# Patient Record
Sex: Male | Born: 1975 | Race: White | Hispanic: No | Marital: Married | Smoking: Never smoker
Health system: Southern US, Community
[De-identification: ages and names within clinical notes are randomized; demographics above are authoritative.]

---

## 2002-10-15 ENCOUNTER — Encounter: Payer: Self-pay | Admitting: Emergency Medicine

## 2002-10-15 ENCOUNTER — Emergency Department (HOSPITAL_COMMUNITY): Admission: EM | Admit: 2002-10-15 | Discharge: 2002-10-15 | Payer: Self-pay | Admitting: Emergency Medicine

## 2008-09-24 ENCOUNTER — Emergency Department (HOSPITAL_BASED_OUTPATIENT_CLINIC_OR_DEPARTMENT_OTHER): Admission: EM | Admit: 2008-09-24 | Discharge: 2008-09-24 | Payer: Self-pay | Admitting: Emergency Medicine

## 2008-09-24 ENCOUNTER — Ambulatory Visit: Payer: Self-pay | Admitting: Diagnostic Radiology

## 2008-11-29 ENCOUNTER — Emergency Department (HOSPITAL_BASED_OUTPATIENT_CLINIC_OR_DEPARTMENT_OTHER): Admission: EM | Admit: 2008-11-29 | Discharge: 2008-11-29 | Payer: Self-pay | Admitting: Emergency Medicine

## 2009-09-20 ENCOUNTER — Emergency Department (HOSPITAL_COMMUNITY): Admission: EM | Admit: 2009-09-20 | Discharge: 2009-09-20 | Payer: Self-pay | Admitting: Emergency Medicine

## 2009-09-25 ENCOUNTER — Emergency Department (HOSPITAL_COMMUNITY): Admission: EM | Admit: 2009-09-25 | Discharge: 2009-09-25 | Payer: Self-pay | Admitting: Family Medicine

## 2010-02-11 ENCOUNTER — Emergency Department (HOSPITAL_BASED_OUTPATIENT_CLINIC_OR_DEPARTMENT_OTHER): Admission: EM | Admit: 2010-02-11 | Discharge: 2010-02-11 | Payer: Self-pay | Admitting: Emergency Medicine

## 2010-10-27 LAB — DIFFERENTIAL
Basophils Absolute: 0.1 10*3/uL (ref 0.0–0.1)
Lymphocytes Relative: 7 % — ABNORMAL LOW (ref 12–46)
Monocytes Absolute: 0.8 10*3/uL (ref 0.1–1.0)
Neutro Abs: 11.5 10*3/uL — ABNORMAL HIGH (ref 1.7–7.7)

## 2010-10-27 LAB — CBC
HCT: 48.4 % (ref 39.0–52.0)
Hemoglobin: 15.8 g/dL (ref 13.0–17.0)
MCHC: 32.7 g/dL (ref 30.0–36.0)
MCV: 88.1 fL (ref 78.0–100.0)
Platelets: 236 10*3/uL (ref 150–400)
RBC: 5.49 MIL/uL (ref 4.22–5.81)
RDW: 13.9 % (ref 11.5–15.5)
WBC: 13.5 10*3/uL — ABNORMAL HIGH (ref 4.0–10.5)

## 2010-10-27 LAB — BASIC METABOLIC PANEL
BUN: 19 mg/dL (ref 6–23)
CO2: 29 mEq/L (ref 19–32)
Calcium: 9.9 mg/dL (ref 8.4–10.5)
Chloride: 105 mEq/L (ref 96–112)
Creatinine, Ser: 1.1 mg/dL (ref 0.4–1.5)
GFR calc Af Amer: >60 mL/min (ref >60)
GFR calc non Af Amer: >60 mL/min (ref >60)
Glucose, Bld: 103 mg/dL — ABNORMAL HIGH (ref 70–99)
Potassium: 4.7 mEq/L (ref 3.5–5.1)
Sodium: 145 mEq/L (ref 135–145)

## 2010-10-27 LAB — POCT TOXICOLOGY PANEL

## 2010-10-27 LAB — ETHANOL: Alcohol, Ethyl (B): <5 mg/dL (ref 0–10)

## 2010-10-29 LAB — CBC
HCT: 41.6 % (ref 39.0–52.0)
MCV: 86.9 fL (ref 78.0–100.0)
Platelets: 347 10*3/uL (ref 150–400)
RDW: 12.7 % (ref 11.5–15.5)

## 2010-10-29 LAB — BASIC METABOLIC PANEL
BUN: 20 mg/dL (ref 6–23)
CO2: 27 mEq/L (ref 19–32)
Chloride: 100 mEq/L (ref 96–112)
Glucose, Bld: 96 mg/dL (ref 70–99)
Potassium: 4.8 mEq/L (ref 3.5–5.1)

## 2010-10-29 LAB — DIFFERENTIAL
Basophils Absolute: 0.2 10*3/uL — ABNORMAL HIGH (ref 0.0–0.1)
Eosinophils Relative: 0 % (ref 0–5)
Lymphs Abs: 0.9 10*3/uL (ref 0.7–4.0)
Monocytes Absolute: 1.3 10*3/uL — ABNORMAL HIGH (ref 0.1–1.0)

## 2012-01-15 ENCOUNTER — Emergency Department (HOSPITAL_BASED_OUTPATIENT_CLINIC_OR_DEPARTMENT_OTHER)
Admission: EM | Admit: 2012-01-15 | Discharge: 2012-01-15 | Disposition: A | Discharge status: Home Health | Payer: Medicaid Other | Attending: Emergency Medicine | Admitting: Emergency Medicine

## 2012-01-15 ENCOUNTER — Emergency Department (HOSPITAL_BASED_OUTPATIENT_CLINIC_OR_DEPARTMENT_OTHER): Payer: Medicaid Other

## 2012-01-15 ENCOUNTER — Encounter (HOSPITAL_BASED_OUTPATIENT_CLINIC_OR_DEPARTMENT_OTHER): Payer: Self-pay | Admitting: *Deleted

## 2012-01-15 DIAGNOSIS — T148XXA Other injury of unspecified body region, initial encounter: Secondary | ICD-10-CM | POA: Insufficient documentation

## 2012-01-15 DIAGNOSIS — IMO0002 Reserved for concepts with insufficient information to code with codable children: Secondary | ICD-10-CM

## 2012-01-15 DIAGNOSIS — X500XXA Overexertion from strenuous movement or load, initial encounter: Secondary | ICD-10-CM | POA: Insufficient documentation

## 2012-01-15 MED ORDER — CYCLOBENZAPRINE HCL 5 MG PO TABS: 5.0000 mg | ORAL_TABLET | Freq: Two times a day (BID) | ORAL | Status: AC | PRN

## 2012-01-15 NOTE — ED Provider Notes (Signed)
History/physical exam/procedure(s) were performed by non-physician practitioner and as supervising physician I was immediately available for consultation/collaboration. I have reviewed all notes and am in agreement with care and plan.   Shelden Raborn S Dyquan Minks, MD 01/15/12 1751 

## 2012-01-15 NOTE — Discharge Instructions (Signed)
Degenerative Disc Disease Degenerative disc disease is a condition caused by the changes that occur in the cushions of the backbone (spinal discs) as you grow older. Spinal discs are soft and compressible discs located between the bones of the spine (vertebrae). They act like shock absorbers. Degenerative disc disease can affect the wholespine. However, the neck and lower back are most commonly affected. Many changes can occur in the spinal discs with aging, such as:  The spinal discs may dry and shrink.   Small tears may occur in the tough, outer covering of the disc (annulus).   The disc space may become smaller due to loss of water.   Abnormal growths in the bone (spurs) may occur. This can put pressure on the nerve roots exiting the spinal canal, causing pain.   The spinal canal may become narrowed.  CAUSES  Degenerative disc disease is a condition caused by the changes that occur in the spinal discs with aging. The exact cause is not known, but there is a genetic basis for many patients. Degenerative changes can occur due to loss of fluid in the disc. This makes the disc thinner and reduces the space between the backbones. Small cracks can develop in the outer layer of the disc. This can lead to the breakdown of the disc. You are more likely to get degenerative disc disease if you are overweight. Smoking cigarettes and doing heavy work such as weightlifting can also increase your risk of this condition. Degenerative changes can start after a sudden injury. Growth of bone spurs can compress the nerve roots and cause pain.  SYMPTOMS  The symptoms vary from person to person. Some people may have no pain, while others have severe pain. The pain may be so severe that it can limit your activities. The location of the pain depends on the part of your backbone that is affected. You will have neck or arm pain if a disc in the neck area is affected. You will have pain in your back, buttocks, or legs if a  disc in the lower back is affected. The pain becomes worse while bending, reaching up, or with twisting movements. The pain may start gradually and then get worse as time passes. It may also start after a major or minor injury. You may feel numbness or tingling in the arms or legs.  DIAGNOSIS  Your caregiver will ask you about your symptoms and about activities or habits that may cause the pain. He or she may also ask about any injuries, diseases, ortreatments you have had earlier. Your caregiver will examine you to check for the range of movement that is possible in the affected area, to check for strength in your extremities, and to check for sensation in the areas of the arms and legs supplied by different nerve roots. An X-ray of the spine may be taken. Your caregiver may suggest other imaging tests, such as a computerized magnetic scan (MRI), if needed.  TREATMENT  Treatment includes rest, modifying your activities, and applying ice and heat. Your caregiver may prescribe medicines to reduce your pain and may ask you to do some exercises to strengthen your back. In some cases, you may need surgery. You and your caregiver will decide on the treatment that is best for you. HOME CARE INSTRUCTIONS   Follow proper lifting and walking techniques as advised by your caregiver.   Maintain good posture.   Exercise regularly as advised.   Perform relaxation exercises.   Change your sitting,   standing, and sleeping habits as advised. Change positions frequently.   Lose weight as advised.   Stop smoking if you smoke.   Wear supportive footwear.  SEEK MEDICAL CARE IF:  The pain does not go away within 1 to 4 weeks. SEEK IMMEDIATE MEDICAL CARE IF:   The pain is severe.   You notice weakness in your arms, hands, or legs.   You begin to lose control of your bladder or bowel.  MAKE SURE YOU:   Understand these instructions.   Will watch your condition.   Will get help right away if you are not  doing well or get worse.  Document Released: 05/02/2007 Document Revised: 06/24/2011 Document Reviewed: 05/02/2007 [employer] Patient Information 2012 [employer]. 

## 2012-01-15 NOTE — ED Notes (Signed)
Pt states he originally injured his back at work 2 weeks ago. Took Aleve and it went away. Reinjured yest.

## 2012-01-15 NOTE — ED Provider Notes (Signed)
History     CSN: [id number]  Arrival date & time 01/15/12  1503   First MD Initiated Contact with Patient 01/15/12 1529      Chief Complaint  Patient presents with  . Back Pain    (Consider location/radiation/quality/duration/timing/severity/associated sxs/prior treatment) HPI Comments: Pt states that he was straining to lift heavy object 2 weeks ago and started having neck pain:pt states that he took ibuprofen and aleve with relief and he did the same think again yesterday and it is worse this time:pt c/o pain at the base of his neck and with turning side to side:pt denies numbness but states that he is having paresthesias in his leg bilaterally  Patient is a 36 y.o. male presenting with neck injury. The history is provided by the patient. No language interpreter was used.  Neck Injury This is a new problem. The current episode started 1 to 4 weeks ago. The problem occurs constantly. The problem has been gradually worsening. The symptoms are aggravated by twisting. He has tried nothing for the symptoms.    History reviewed. No pertinent past medical history.  History reviewed. No pertinent past surgical history.  History reviewed. No pertinent family history.  History  Substance Use Topics  . Smoking status: Never Smoker   . Smokeless tobacco: Not on file  . Alcohol Use: No      Review of Systems  Constitutional: Negative.   Respiratory: Negative.   Cardiovascular: Negative.     Allergies  Review of patient's allergies indicates no known allergies.  Home Medications   Current Outpatient Rx  Name Route Sig Dispense Refill  . IBUPROFEN 200 MG PO TABS Oral Take 400 mg by mouth 2 (two) times daily as needed. For pain    . METHADONE HCL 10 MG/ML PO CONC Oral Take 110 mg by mouth daily.    Marland Kitchen NAPROXEN SODIUM 220 MG PO TABS Oral Take 660 mg by mouth 2 (two) times daily as needed. For pain      BP 147/61  Pulse 94  Temp 97.9 F (36.6 C) (Oral)  Resp 20  Ht 6\' 2"   (1.88 m)  Wt 220 lb (99.791 kg)  BMI 28.25 kg/m2  SpO2 100%  Physical Exam  Nursing note and vitals reviewed. Constitutional: He is oriented to person, place, and time. He appears well-developed and well-nourished.  Cardiovascular: Normal rate and regular rhythm.   Pulmonary/Chest: Effort normal and breath sounds normal.  Abdominal: Soft. Bowel sounds are normal. There is no tenderness.  Musculoskeletal: Normal range of motion.       Cervical back: He exhibits tenderness and bony tenderness.       Thoracic back: Normal.       Lumbar back: Normal.  Neurological: He is alert and oriented to person, place, and time. He exhibits normal muscle tone. Coordination normal.  Skin: Skin is warm and dry.    ED Course  Procedures (including critical care time)  Labs Reviewed - No data to display Dg Cervical Spine Complete  01/15/2012  *RADIOLOGY REPORT*  Clinical Data: Persistent neck pain.  CERVICAL SPINE - COMPLETE 4+ VIEW  Comparison: Cervical spine radiographs 09/20/2009.  Findings: Multiple views of the cervical spine demonstrate no acute displaced fractures or compression fractures.  Prevertebral soft tissues are normal.  Alignment is anatomic.  Mild multilevel degenerative disc disease is noted, most severe C5-C6 and C6-C7. Mild multilevel facet arthropathy is also noted.  IMPRESSION: 1.  No acute radiographic abnormality of the cervical spine. 2.  Mild multilevel degenerative disc disease and cervical spondylosis, as above.  Original Report Authenticated By: Florencia Reasons, M.D.     1. Degenerative disc disease   2. Muscle strain       MDM  Pt having no neurologic symptoms or symptoms of incontinence:will put on muscle relaxers:pt is on methadone        Teressa Lower, NP 01/15/12 1701

## 2012-06-05 DIAGNOSIS — R1084 Generalized abdominal pain: Secondary | ICD-10-CM | POA: Insufficient documentation

## 2012-06-05 DIAGNOSIS — F419 Anxiety disorder, unspecified: Secondary | ICD-10-CM | POA: Insufficient documentation

## 2012-10-27 DIAGNOSIS — K589 Irritable bowel syndrome without diarrhea: Secondary | ICD-10-CM | POA: Insufficient documentation

## 2020-04-19 ENCOUNTER — Encounter (HOSPITAL_COMMUNITY): Payer: Self-pay

## 2020-04-19 ENCOUNTER — Telehealth (HOSPITAL_COMMUNITY): Payer: Self-pay | Admitting: Emergency Medicine

## 2020-04-19 ENCOUNTER — Other Ambulatory Visit: Payer: Self-pay

## 2020-04-19 ENCOUNTER — Ambulatory Visit (HOSPITAL_COMMUNITY)
Admission: EM | Admit: 2020-04-19 | Discharge: 2020-04-19 | Disposition: A | Discharge status: Home / Self Care | Payer: Medicaid Other | Attending: Physician Assistant | Admitting: Physician Assistant

## 2020-04-19 DIAGNOSIS — M25572 Pain in left ankle and joints of left foot: Secondary | ICD-10-CM

## 2020-04-19 DIAGNOSIS — M109 Gout, unspecified: Secondary | ICD-10-CM

## 2020-04-19 MED ORDER — PREDNISONE 5 MG (48) PO TBPK: ORAL_TABLET | ORAL | 0 refills | Status: DC

## 2020-04-19 MED ORDER — DOXYCYCLINE HYCLATE 100 MG PO CAPS: 100.0000 mg | ORAL_CAPSULE | Freq: Two times a day (BID) | ORAL | 0 refills | Status: DC

## 2020-04-19 NOTE — ED Provider Notes (Signed)
[facility]    CSN: [id number] Arrival date & time: 04/19/20  1026      History   Chief Complaint Chief Complaint  Patient presents with  . Ankle Pain    HPI Michael Hurley is a 44 y.o. male.    With a 3 day history of left ankle pain. No known injury. "very painful". Redness and warmth is noted and awoke him from sleep last pm. No known history of gout. No fever or chills.      History reviewed. No pertinent past medical history.  There are no problems to display for this patient.   History reviewed. No pertinent surgical history.     Home Medications    Prior to Admission medications   Medication Sig Start Date End Date Taking? Authorizing Provider  ibuprofen (ADVIL,MOTRIN) 200 MG tablet Take 400 mg by mouth 2 (two) times daily as needed. For pain    [provider]  methadone (DOLOPHINE) 10 MG/ML solution Take 110 mg by mouth daily.    [provider]  naproxen sodium (ANAPROX) 220 MG tablet Take 660 mg by mouth 2 (two) times daily as needed. For pain    [provider]    Family History Family History  Family history unknown: Yes    Social History Social History   Tobacco Use  . Smoking status: Never Smoker  Substance Use Topics  . Alcohol use: No  . Drug use: No     Allergies   Patient has no known allergies.   Review of Systems Review of Systems  Constitutional: Negative for fever.  Musculoskeletal: Positive for arthralgias.  Skin: Positive for color change.  All other systems reviewed and are negative.    Physical Exam Triage Vital Signs ED Triage Vitals  Enc Vitals Group     BP 04/19/20 1129 130/79     Pulse Rate 04/19/20 1129 83     Resp 04/19/20 1129 17     Temp 04/19/20 1129 97.6 F (36.4 C)     Temp Source 04/19/20 1129 Oral     SpO2 04/19/20 1129 100 %     Weight --      Height --      Head Circumference --      Peak Flow --      Pain Score 04/19/20 1128 7     Pain Loc --       Pain Edu? --      Excl. in GC? --    No data found.  Updated Vital Signs BP 130/79 (BP Location: Right Arm)   Pulse 83   Temp 97.6 F (36.4 C) (Oral)   Resp 17   SpO2 100%   Visual Acuity Right Eye Distance:   Left Eye Distance:   Bilateral Distance:    Right Eye Near:   Left Eye Near:    Bilateral Near:     Physical Exam Vitals and nursing note reviewed.  Constitutional:      General: He is not in acute distress.    Appearance: Normal appearance. He is not ill-appearing.  HENT:     Head: Normocephalic and atraumatic.  Pulmonary:     Effort: Pulmonary effort is normal.  Musculoskeletal:        General: Swelling and tenderness present. No signs of injury.     Comments: Left ankle with erythema, warmth and swelling along the ankle joint, pain with light palpation. No frank joint effusion noted  Skin:    General:  Skin is warm and dry.     Comments: Open sores on BLE, no drainage, excoriations are also noted, no frank abscess  Neurological:     General: No focal deficit present.     Mental Status: He is alert.  Psychiatric:        Mood and Affect: Mood normal.        Behavior: Behavior normal.      UC Treatments / Results  Labs (all labs ordered are listed, but only abnormal results are displayed) Labs Reviewed - No data to display  EKG   Radiology No results found.  Procedures Procedures (including critical care time)  Medications Ordered in UC Medications - No data to display  Initial Impression / Assessment and Plan / UC Course  I have reviewed the triage vital signs and the nursing notes.  Pertinent labs & imaging results that were available during my care of the patient were reviewed by me and considered in my medical decision making (see chart for details).     Suspect gout, though with surrounding sores cannot rule out cellulitis. Treat with prednisone pack and cover with Doxycycline. He is instructed to return to ED with fevers, worsening  redness or signs of infection. Otherwise try to establish with a PCP for overall health maintenance, but also to check a uric acid when NOT flaring.  Final Clinical Impressions(s) / UC Diagnoses   Final diagnoses:  None   Discharge Instructions   None    ED Prescriptions    None     PDMP not reviewed this encounter.   Riki Sheer, [place] 04/19/20 1217

## 2020-04-19 NOTE — Discharge Instructions (Addendum)
I suspect you are having a gout flare. Start the prednisone today, may take with 2 Tylenol Arthritis if needed. Because of your sores I am giving you an antibiotic in case this is an infection. If the redness gets worse, pain worse, or start running a fever then that is an emergent need. Keep elevated, ice will also help. Try to maintain with a PCP to check your gout levels (uric acid) in 2-4 weeks.

## 2020-04-19 NOTE — ED Triage Notes (Signed)
Pt presents with left ankle pain & swelling X 3 days: pt states he cannot remember injury.

## 2020-04-25 ENCOUNTER — Telehealth (HOSPITAL_COMMUNITY): Payer: Self-pay

## 2020-04-25 NOTE — Telephone Encounter (Signed)
Pt called concerning his RX prednisone that was not sent over to the pharmacy. Staff called [facility] and gave a verbal script to Pharmacist Ree [place] for patient.

## 2020-04-29 ENCOUNTER — Encounter (HOSPITAL_COMMUNITY): Payer: Self-pay

## 2020-04-29 ENCOUNTER — Other Ambulatory Visit: Payer: Self-pay

## 2020-04-29 ENCOUNTER — Ambulatory Visit (HOSPITAL_COMMUNITY)
Admission: EM | Admit: 2020-04-29 | Discharge: 2020-04-29 | Disposition: A | Discharge status: Home / Self Care | Payer: Medicaid Other | Attending: Family Medicine | Admitting: Family Medicine

## 2020-04-29 ENCOUNTER — Ambulatory Visit (INDEPENDENT_AMBULATORY_CARE_PROVIDER_SITE_OTHER): Payer: Medicaid Other

## 2020-04-29 DIAGNOSIS — W228XXA Striking against or struck by other objects, initial encounter: Secondary | ICD-10-CM | POA: Diagnosis not present

## 2020-04-29 DIAGNOSIS — M79672 Pain in left foot: Secondary | ICD-10-CM

## 2020-04-29 DIAGNOSIS — M25572 Pain in left ankle and joints of left foot: Secondary | ICD-10-CM

## 2020-04-29 MED ORDER — IBUPROFEN 800 MG PO TABS: 800.0000 mg | ORAL_TABLET | Freq: Three times a day (TID) | ORAL | 0 refills | Status: DC

## 2020-04-29 NOTE — ED Provider Notes (Signed)
[facility]    CSN: [id number] Arrival date & time: 04/29/20  1202      History   Chief Complaint Chief Complaint  Patient presents with  . Follow-up    HPI Michael Hurley is a 44 y.o. male.   HPI  Patient was seen about a week ago for what was thought to be possible gout of his ankle.  He had open wounds.  There is erythema and pain.  No trouble.  He was treated with doxycycline and prednisone. Patient took a couple of doxycycline, decided they were working, and stopped Patient did not take his prednisone until the eighth, 4 days ago.  He has managed to take 11 days of his 12-day Dosepak and just the last 4 days.  Denies any side effects.  He states his ankle and foot are no better. He now recalls that he dropped a heavy object on his foot at work before it started hurting.  He wishes x-rays He states that he cannot bear weight on this foot without severe pain  History reviewed. No pertinent past medical history.  There are no problems to display for this patient.   History reviewed. No pertinent surgical history.     Home Medications    Prior to Admission medications   Medication Sig Start Date End Date Taking? Authorizing Provider  ibuprofen (ADVIL) 800 MG tablet Take 1 tablet (800 mg total) by mouth 3 (three) times daily. 04/29/20   Eustace Moore, MD  methadone (DOLOPHINE) 10 MG/ML solution Take 110 mg by mouth daily.    [provider]  naproxen sodium (ANAPROX) 220 MG tablet Take 660 mg by mouth 2 (two) times daily as needed. For pain    [provider]    Family History Family History  Family history unknown: Yes    Social History Social History   Tobacco Use  . Smoking status: Never Smoker  Substance Use Topics  . Alcohol use: No  . Drug use: No     Allergies   Patient has no known allergies.   Review of Systems Review of Systems See HPI  Physical Exam Triage Vital Signs ED Triage Vitals  Enc Vitals  Group     BP 04/29/20 1515 136/79     Pulse Rate 04/29/20 1515 77     Resp 04/29/20 1515 17     Temp 04/29/20 1515 98 F (36.7 C)     Temp Source 04/29/20 1515 Oral     SpO2 04/29/20 1515 100 %     Weight --      Height --      Head Circumference --      Peak Flow --      Pain Score 04/29/20 1513 7     Pain Loc --      Pain Edu? --      Excl. in GC? --    No data found.  Updated Vital Signs BP 136/79 (BP Location: Right Arm)   Pulse 77   Temp 98 F (36.7 C) (Oral)   Resp 17   SpO2 100%      Physical Exam Constitutional:      General: He is not in acute distress.    Appearance: He is well-developed.     Comments: In wheelchair  HENT:     Head: Normocephalic and atraumatic.     Mouth/Throat:     Comments: Mask in place Eyes:     Conjunctiva/sclera: Conjunctivae normal.  Pupils: Pupils are equal, round, and reactive to light.  Cardiovascular:     Rate and Rhythm: Normal rate.  Pulmonary:     Effort: Pulmonary effort is normal. No respiratory distress.  Abdominal:     General: There is no distension.     Palpations: Abdomen is soft.  Musculoskeletal:        General: Normal range of motion.     Cervical back: Normal range of motion.     Comments: Mild to moderate soft tissue swelling in the left foot and ankle.  No erythema or warmth.  Limited range of motion of the ankle secondary to pain.  Tenderness diffusely in the anterior ankle joint.  No tenderness over the malleoli.  There is tenderness in the midfoot over the patient states he dropped a heavy object.  Mild soft tissue swelling in this area as well.  Skin:    General: Skin is warm and dry.  Neurological:     Mental Status: He is alert.  Psychiatric:        Mood and Affect: Mood normal.        Behavior: Behavior normal.      UC Treatments / Results  Labs (all labs ordered are listed, but only abnormal results are displayed) Labs Reviewed - No data to display  EKG   Radiology DG Ankle  Complete Left  Result Date: 04/29/2020 CLINICAL DATA:  Acute left ankle pain after injury 10 days ago. EXAM: LEFT ANKLE COMPLETE - 3+ VIEW COMPARISON:  None. FINDINGS: There is no evidence of fracture, dislocation, or joint effusion. There is no evidence of arthropathy or other focal bone abnormality. Soft tissues are unremarkable. IMPRESSION: Negative. Electronically Signed   By: Lupita Raider M.D.   On: 04/29/2020 17:24   DG Foot Complete Left  Result Date: 04/29/2020 CLINICAL DATA:  Left foot pain after injury 10 days ago EXAM: LEFT FOOT - COMPLETE 3+ VIEW COMPARISON:  None. FINDINGS: There is no evidence of fracture or dislocation. Minimal degenerative changes seen involving the first metatarsophalangeal joint. Soft tissues are unremarkable. 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 No acute abnormality is noted. Electronically Signed   By: Lupita Raider M.D.   On: 04/29/2020 17:22    Procedures Procedures (including critical care time)  Medications Ordered in UC Medications - No data to display  Initial Impression / Assessment and Plan / UC Course  I have reviewed the triage vital signs and the nursing notes.  Pertinent labs & imaging results that were available during my care of the patient were reviewed by me and considered in my medical decision making (see chart for details).     Unclear etiology of patient's severe pain.  Will refer to sports medicine or podiatry for follow-up.  We will treat him with ibuprofen, crutches, limited weightbearing.  He could have early reflex sympathetic dystrophy although he does not have skin changes or temperature changes of the foot. Final Clinical Impressions(s) / UC Diagnoses   Final diagnoses:  Acute left ankle pain  Acute foot pain, left     Discharge Instructions     Stay off of foot and ankle while painful Elevate to reduce pain and swelling Take ibuprofen 3 times a day with  food Wear boot, use crutches for pain relief See orthopedic or podiatry in follow-up   ED Prescriptions    Medication Sig Dispense Auth. Provider   ibuprofen (ADVIL) 800 MG tablet Take 1 tablet (800 mg  total) by mouth 3 (three) times daily. 21 tablet Eustace Moore, MD     PDMP not reviewed this encounter.   Eustace Moore, MD 04/29/20 2129

## 2020-04-29 NOTE — ED Triage Notes (Signed)
Pt presents for follow up for right foot & ankle pain for over a week; pt states after prescribed medication he has not much relief.

## 2020-04-29 NOTE — Discharge Instructions (Signed)
Stay off of foot and ankle while painful Elevate to reduce pain and swelling Take ibuprofen 3 times a day with food Wear boot, use crutches for pain relief See orthopedic or podiatry in follow-up

## 2020-05-12 ENCOUNTER — Ambulatory Visit: Payer: Medicaid Other | Admitting: Podiatry

## 2020-05-26 ENCOUNTER — Other Ambulatory Visit: Payer: Self-pay

## 2020-05-26 ENCOUNTER — Ambulatory Visit (INDEPENDENT_AMBULATORY_CARE_PROVIDER_SITE_OTHER): Payer: Medicaid Other | Admitting: Podiatry

## 2020-05-26 ENCOUNTER — Ambulatory Visit (INDEPENDENT_AMBULATORY_CARE_PROVIDER_SITE_OTHER): Payer: Medicaid Other

## 2020-05-26 DIAGNOSIS — M659 Synovitis and tenosynovitis, unspecified: Secondary | ICD-10-CM | POA: Diagnosis not present

## 2020-05-26 DIAGNOSIS — M216X1 Other acquired deformities of right foot: Secondary | ICD-10-CM | POA: Diagnosis not present

## 2020-05-26 DIAGNOSIS — M109 Gout, unspecified: Secondary | ICD-10-CM | POA: Diagnosis not present

## 2020-05-26 MED ORDER — MELOXICAM 15 MG PO TABS: 15.0000 mg | ORAL_TABLET | Freq: Every day | ORAL | 1 refills | Status: DC

## 2020-05-26 NOTE — Progress Notes (Signed)
   Subjective:  44 y.o. male presenting today for evaluation of right ankle pain is been going on for approximately 1 month now.  Patient states that while he was at work he noticed right ankle pain that gradually got worse throughout the day.  It is currently very painful and severe and he has been wearing a cam boot for the past month.  He was prescribed a Medrol Dosepak which did help with the inflammation and swelling however he continues to have some pain that is slowly healing with time.  He presents for further treatment and evaluation  No past medical history on file.   Objective / Physical Exam:  General:  The patient is alert and oriented x3 in no acute distress. Dermatology:  Skin is warm, dry and supple bilateral lower extremities. Negative for open lesions or macerations. Vascular:  Palpable pedal pulses bilaterally. No edema or erythema noted. Capillary refill within normal limits. Neurological:  Epicritic and protective threshold grossly intact bilaterally.  Musculoskeletal Exam:  Pain on palpation to the anterior lateral medial aspects of the patient's right ankle. Mild edema noted. Range of motion within normal limits to all pedal and ankle joints bilateral. Muscle strength 5/5 in all groups bilateral.   Assessment: 1.  Capsulitis right ankle  Plan of Care:  1. Patient was evaluated. X-Rays reviewed that were taken in the urgent care.  2. Injection of 0.5 mL Celestone Soluspan injected in the patient's right ankle. 3.  Prescription for meloxicam 15 mg daily 4.  Discontinue cam boot.  Ankle brace dispensed today.  Wear daily 5.  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   Felecia Shelling, DPM [facility]  Dr. Felecia Shelling, DPM    [address]                                        [place], [place] [address]                Office [phone]  Fax [phone]

## 2020-06-25 ENCOUNTER — Ambulatory Visit: Payer: Medicaid Other | Admitting: Podiatry

## 2021-03-10 IMAGING — DX DG FOOT COMPLETE 3+V*L*
3 series · 3 of 3 positions shown · non-contrast
Comparison: None.

CLINICAL DATA: Left foot pain after injury 10 days ago

EXAM:
LEFT FOOT - COMPLETE 3+ VIEW

[foot ap]
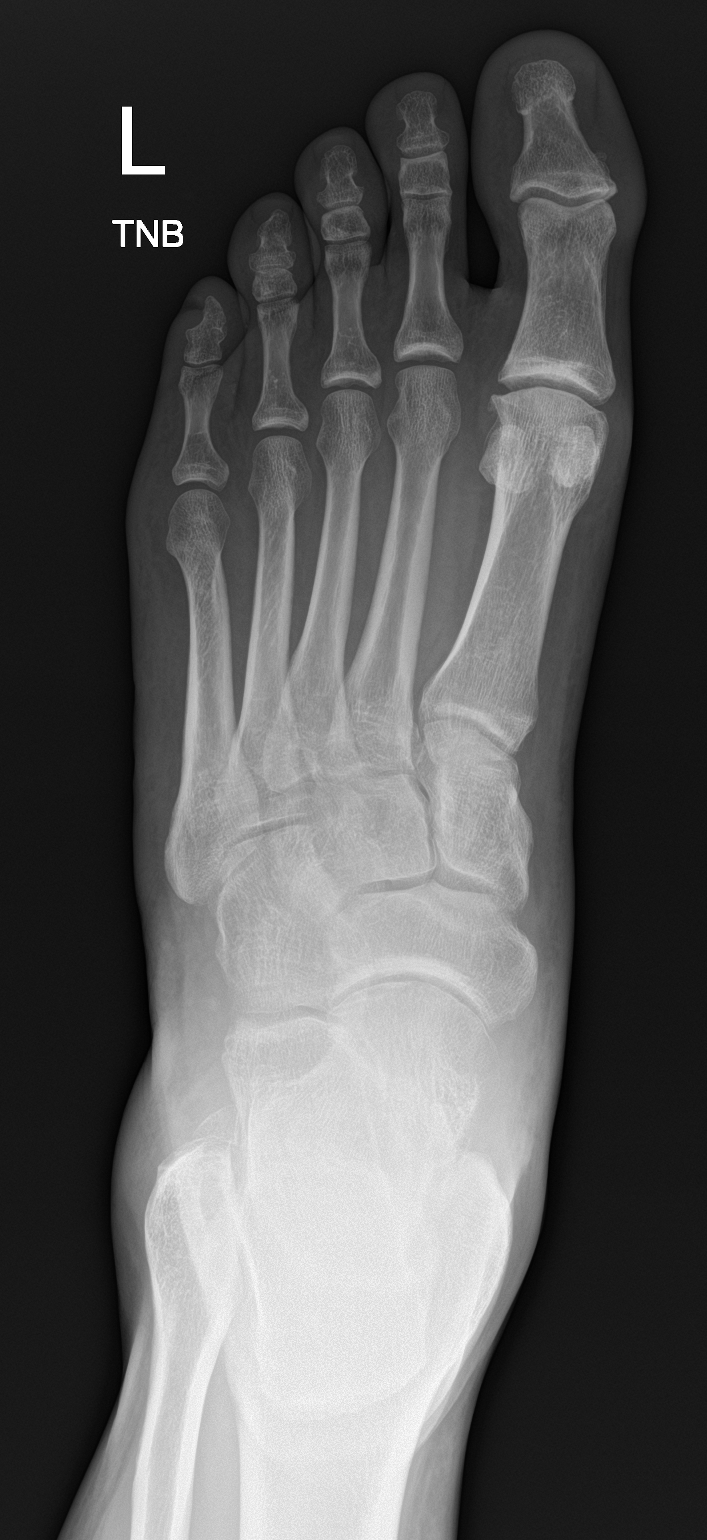

[foot obl]
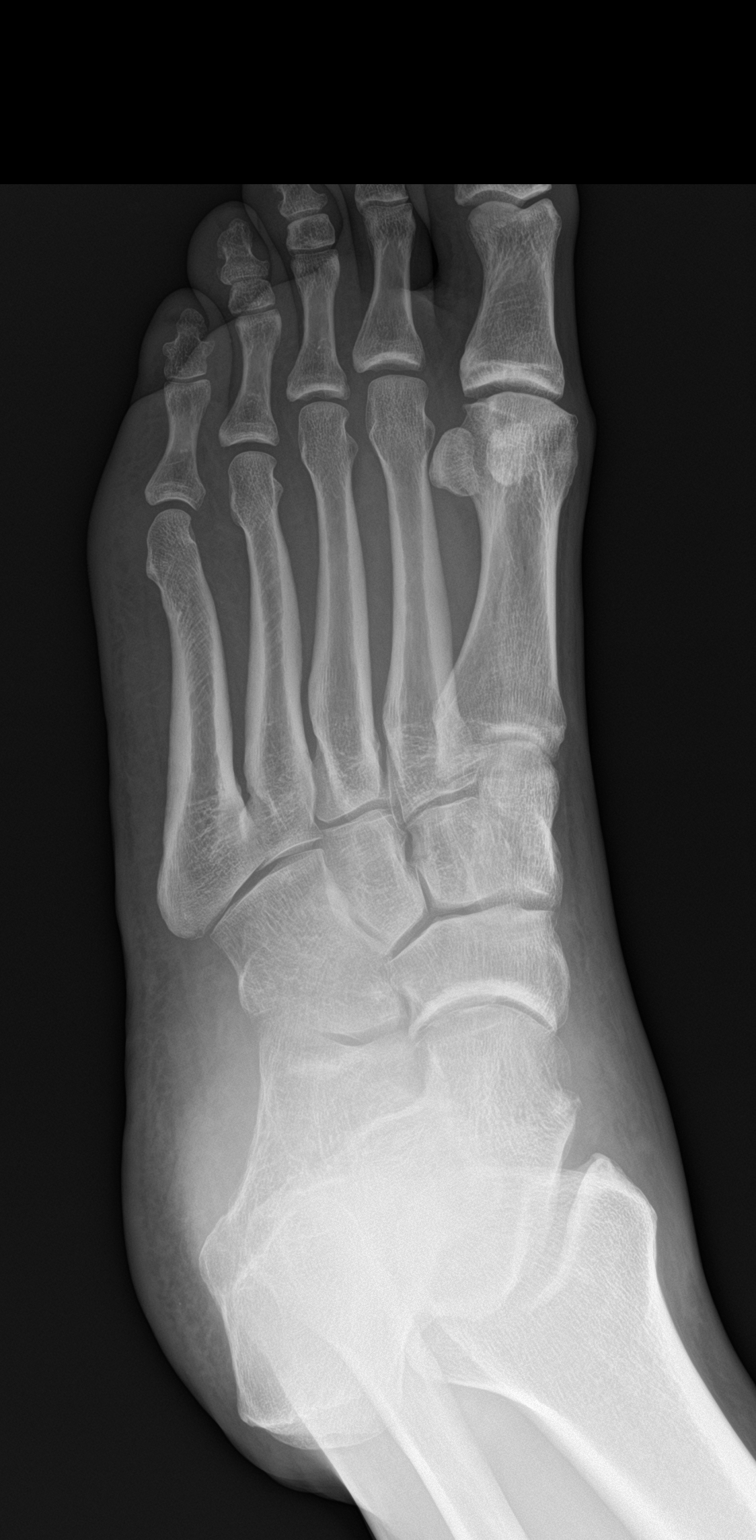

[foot lat]
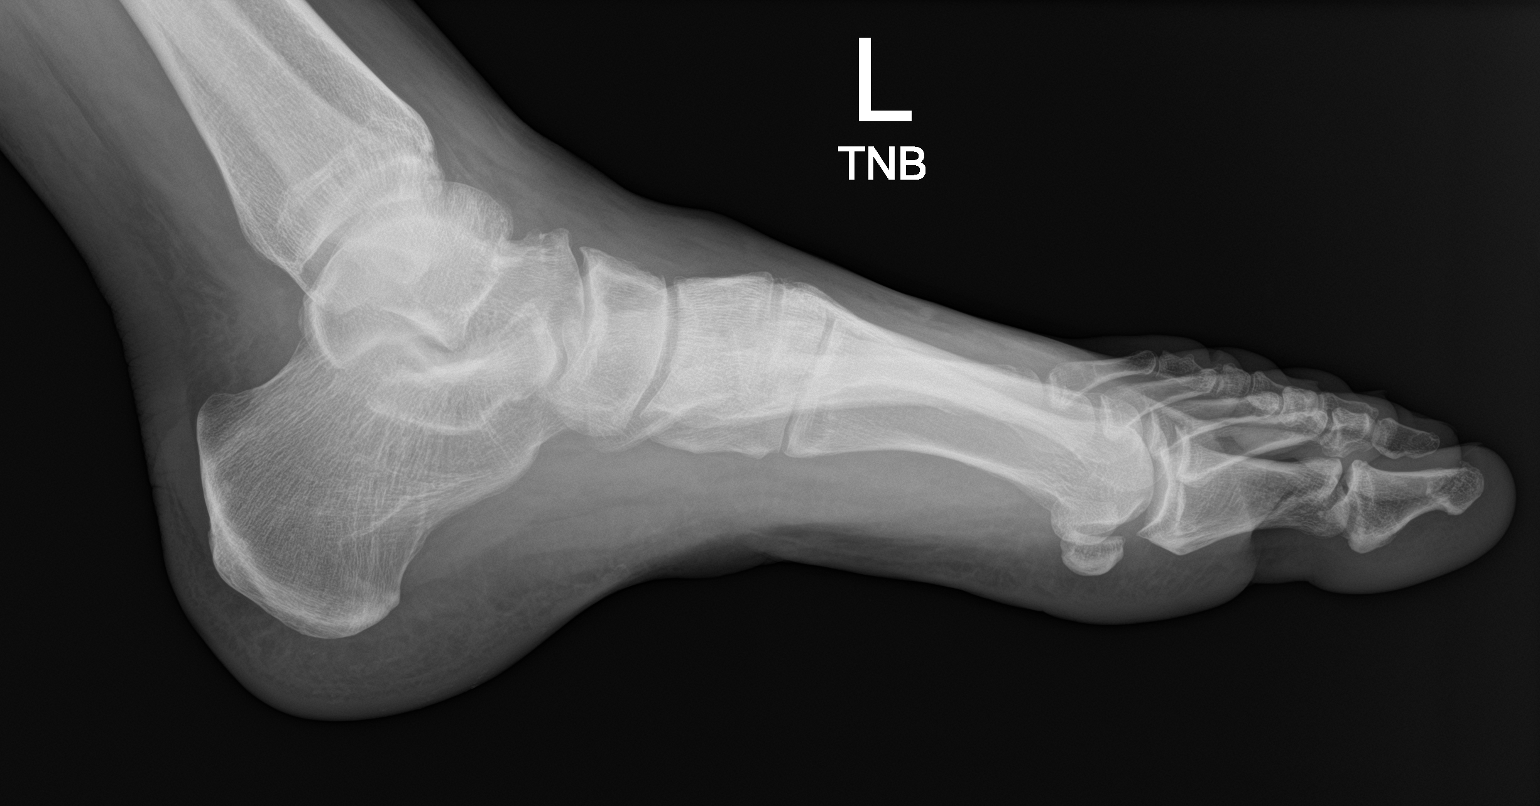

[3 of 3 positions shown; findings below may reference images not displayed]

FINDINGS: There is no evidence of fracture or dislocation. Minimal
degenerative changes seen involving the first metatarsophalangeal
joint. Soft tissues are unremarkable.
IMPRESSION: Minimal degenerative joint disease of the first metatarsophalangeal
joint. No acute abnormality is noted.

## 2022-08-09 ENCOUNTER — Emergency Department (HOSPITAL_BASED_OUTPATIENT_CLINIC_OR_DEPARTMENT_OTHER): Payer: Medicaid Other

## 2022-08-09 ENCOUNTER — Other Ambulatory Visit: Payer: Self-pay

## 2022-08-09 ENCOUNTER — Emergency Department (HOSPITAL_BASED_OUTPATIENT_CLINIC_OR_DEPARTMENT_OTHER)
Admission: EM | Admit: 2022-08-09 | Discharge: 2022-08-10 | Disposition: A | Discharge status: Home / Self Care | Payer: Medicaid Other | Attending: Emergency Medicine | Admitting: Emergency Medicine

## 2022-08-09 DIAGNOSIS — Z23 Encounter for immunization: Secondary | ICD-10-CM | POA: Insufficient documentation

## 2022-08-09 DIAGNOSIS — L03011 Cellulitis of right finger: Secondary | ICD-10-CM

## 2022-08-09 DIAGNOSIS — M79641 Pain in right hand: Secondary | ICD-10-CM | POA: Diagnosis present

## 2022-08-09 LAB — BASIC METABOLIC PANEL
Anion gap: 10 (ref 5–15)
BUN: 16 mg/dL (ref 6–20)
CO2: 27 mmol/L (ref 22–32)
Calcium: 10 mg/dL (ref 8.9–10.3)
Chloride: 100 mmol/L (ref 98–111)
Creatinine, Ser: 0.96 mg/dL (ref 0.61–1.24)
GFR, Estimated: >60 mL/min (ref >60)
Glucose, Bld: 107 mg/dL — ABNORMAL HIGH (ref 70–99)
Potassium: 3.3 mmol/L — ABNORMAL LOW (ref 3.5–5.1)
Sodium: 137 mmol/L (ref 135–145)

## 2022-08-09 LAB — CBC
HCT: 44.1 % (ref 39.0–52.0)
Hemoglobin: 14.6 g/dL (ref 13.0–17.0)
MCH: 29 pg (ref 26.0–34.0)
MCHC: 33.1 g/dL (ref 30.0–36.0)
MCV: 87.5 fL (ref 80.0–100.0)
Platelets: 298 10*3/uL (ref 150–400)
RBC: 5.04 MIL/uL (ref 4.22–5.81)
RDW: 13.5 % (ref 11.5–15.5)
WBC: 10.9 10*3/uL — ABNORMAL HIGH (ref 4.0–10.5)
nRBC: 0 % (ref 0.0–0.2)

## 2022-08-09 MED ORDER — TETANUS-DIPHTH-ACELL PERTUSSIS 5-2.5-18.5 LF-MCG/0.5 IM SUSY
0.5000 mL | PREFILLED_SYRINGE | Freq: Once | INTRAMUSCULAR | Status: AC
  Administered 2022-08-09: 0.5 mL via INTRAMUSCULAR
  Filled 2022-08-09: qty 0.5

## 2022-08-09 MED ORDER — DOXYCYCLINE HYCLATE 100 MG PO CAPS
100.0000 mg | ORAL_CAPSULE | Freq: Two times a day (BID) | ORAL | 0 refills | Status: DC
  Filled 2022-08-09: qty 20, 10d supply, fill #0

## 2022-08-09 MED ORDER — OXYCODONE-ACETAMINOPHEN 5-325 MG PO TABS
1.0000 | ORAL_TABLET | Freq: Once | ORAL | Status: AC
  Administered 2022-08-09: 1 via ORAL
  Filled 2022-08-09: qty 1

## 2022-08-09 NOTE — ED Provider Notes (Signed)
[facility] Provider Note   CSN: [id number] Arrival date & time: 08/09/22  1710     History  Chief Complaint  Patient presents with   Hand Pain    Michael Hurley is a 47 y.o. male.  Pt reports he had a dog bite his thumb in December.  Pt reports wound became infected and then healed.  Pt reports finger began swelling and became painful 3 days ago.  Pt reports wound drains when he pushes on it.   The history is provided by the patient. No language interpreter was used.  Hand Pain This is a new problem. The current episode started more than 2 days ago. The problem occurs constantly. The problem has been gradually worsening. Nothing aggravates the symptoms. Nothing relieves the symptoms.       Home Medications Prior to Admission medications   Medication Sig Start Date End Date Taking? Authorizing Provider  ALPRAZolam Duanne Moron) 0.5 MG tablet Take by mouth. 10/27/12   [provider]  ibuprofen (ADVIL) 800 MG tablet Take 1 tablet (800 mg total) by mouth 3 (three) times daily. 04/29/20   Raylene Everts, MD  meloxicam (MOBIC) 15 MG tablet Take 1 tablet (15 mg total) by mouth daily. 05/26/20   Edrick Kins, DPM  methadone (DOLOPHINE) 10 MG/ML solution Take 110 mg by mouth daily.    [provider]  naproxen sodium (ANAPROX) 220 MG tablet Take 660 mg by mouth 2 (two) times daily as needed. For pain    [provider]      Allergies    Patient has no known allergies.    Review of Systems   Review of Systems  Constitutional:  Negative for fever.  All other systems reviewed and are negative.   Physical Exam Updated Vital Signs BP (!) 149/77 (BP Location: Left Arm)   Pulse (!) 104   Temp 97.7 F (36.5 C) (Oral)   Resp 19   Ht 6\' 2"  (1.88 m)   Wt 99.8 kg   SpO2 100%   BMI 28.25 kg/m  Physical Exam Vitals and nursing note reviewed.  Constitutional:      Appearance: He is well-developed.  HENT:      Head: Normocephalic.  Pulmonary:     Effort: Pulmonary effort is normal.  Abdominal:     General: There is no distension.  Musculoskeletal:        General: Normal range of motion.     Cervical back: Normal range of motion.     Comments: Swollen tender erythematous right thumb,  decreased range of motion,  nv and ns intact   Neurological:     Mental Status: He is alert and oriented to person, place, and time.  Psychiatric:        Mood and Affect: Mood normal.     ED Results / Procedures / Treatments   Labs (all labs ordered are listed, but only abnormal results are displayed) Labs Reviewed  CBC - Abnormal; Notable for the following components:      Result Value   WBC 10.9 (*)    All other components within normal limits  BASIC METABOLIC PANEL - Abnormal; Notable for the following components:   Potassium 3.3 (*)    Glucose, Bld 107 (*)    All other components within normal limits    EKG None  Radiology DG Hand Complete Right  Result Date: 08/09/2022 CLINICAL DATA:  Right thumb swelling EXAM: RIGHT HAND - COMPLETE 3+  VIEW COMPARISON:  None Available. FINDINGS: There is no evidence of fracture or dislocation. There is no evidence of arthropathy or other focal bone abnormality. Soft tissue edema of the right thumb. More mild soft tissue edema of the distal finger. IMPRESSION: 1. No fracture or dislocation of the right hand. 2. Soft tissue edema of the right thumb and more mild soft tissue edema of the distal finger. Electronically Signed   By: Yetta Glassman M.D.   On: 08/09/2022 18:23    Procedures Procedures    Medications Ordered in ED Medications - No data to display  ED Course/ Medical Decision Making/ A&P                             Medical Decision Making Pt complains of   Amount and/or Complexity of Data Reviewed Labs: ordered. Decision-making details documented in ED Course.    Details: Labs ordered reviewed and interpreted  wbc is 10.9  Radiology: ordered  and independent interpretation performed. Decision-making details documented in ED Course.    Details: Xray hand shows soft tissue edema.   Discussion of management or test interpretation with external provider(s): Hand surgery on call consulted  Dr. Caralyn Guile advised he will see pt in office tomorrow.  He advised antibiotics and splint   Risk Prescription drug management. Risk Details: Pt given tetanus,                 Final Clinical Impression(s) / ED Diagnoses Final diagnoses:  Cellulitis of right thumb    Rx / DC Orders ED Discharge Orders          Ordered    doxycycline (VIBRAMYCIN) 100 MG capsule  2 times daily        08/09/22 2357          An After Visit Summary was printed and given to the patient.     Fransico Meadow, PA-C 08/10/22 0000    Regan Lemming, MD 08/10/22 [phone]

## 2022-08-09 NOTE — Discharge Instructions (Addendum)
Return if any problems.

## 2022-08-09 NOTE — ED Triage Notes (Signed)
Pt arrived POV, ambulatory. Pt caox4. Pt c/o pain and swelling in the L thumb that he reports was initially from a dog bite 12/20. Pt further states "it healed, but then got infected again." Pt reports he noticed the swelling and pain in his thumb approx 3 days ago. Pt afebrile. Denies pain at present, last took Motrin at 1700.

## 2022-08-09 NOTE — ED Provider Triage Note (Signed)
Emergency Medicine Provider Triage Evaluation Note  Michael Hurley , a 47 y.o. male  was evaluated in triage.  Pt complains of an infection to his right thumb.   Review of Systems  Positive: Swelling  Negative: fever  Physical Exam  BP (!) 175/88 (BP Location: Left Arm)   Pulse (!) 103   Temp 97.9 F (36.6 C) (Oral)   Resp 19   Ht 6\' 2"  (1.88 m)   Wt 99.8 kg   SpO2 100%   BMI 28.25 kg/m  Gen:   Awake, no distress   Resp:  Normal effort  MSK:   Swollen right thumb,  decreased range of motion nv and ns intact  Other:    Medical Decision Making  Medically screening exam initiated at 5:44 PM.  Appropriate orders placed.  Michael Hurley was informed that the remainder of the evaluation will be completed by another provider, this initial triage assessment does not replace that evaluation, and the importance of remaining in the ED until their evaluation is complete.     Michael Hurley, [place] 08/09/22 1745

## 2022-08-10 ENCOUNTER — Other Ambulatory Visit (HOSPITAL_BASED_OUTPATIENT_CLINIC_OR_DEPARTMENT_OTHER): Payer: Self-pay

## 2022-08-10 MED ORDER — DOXYCYCLINE HYCLATE 100 MG PO CAPS: 100.0000 mg | ORAL_CAPSULE | Freq: Two times a day (BID) | ORAL | 0 refills | Status: DC

## 2022-08-10 MED ORDER — CEPHALEXIN 500 MG PO CAPS: 500.0000 mg | ORAL_CAPSULE | Freq: Four times a day (QID) | ORAL | 0 refills | Status: DC

## 2022-08-10 MED ORDER — DOXYCYCLINE HYCLATE 100 MG PO TABS
100.0000 mg | ORAL_TABLET | Freq: Once | ORAL | Status: AC
  Administered 2022-08-10: 100 mg via ORAL
  Filled 2022-08-10: qty 1

## 2022-08-10 MED ORDER — CEPHALEXIN 250 MG PO CAPS
500.0000 mg | ORAL_CAPSULE | Freq: Once | ORAL | Status: AC
  Administered 2022-08-10: 500 mg via ORAL
  Filled 2022-08-10: qty 2

## 2022-08-10 NOTE — ED Notes (Signed)
Pt verbalized understanding of d/c instructions, meds, and followup care. Denies questions. VSS, no distress noted. Steady gait to exit with all belongings. Scheduled to see ortho tomorrow.

## 2023-01-11 ENCOUNTER — Ambulatory Visit (INDEPENDENT_AMBULATORY_CARE_PROVIDER_SITE_OTHER): Payer: Medicaid Other | Admitting: Family Medicine

## 2023-01-11 ENCOUNTER — Encounter: Payer: Self-pay | Admitting: Family Medicine

## 2023-01-11 VITALS — BP 136/82 | HR 76 | Temp 97.6°F | Ht 73.5 in | Wt 210.0 lb

## 2023-01-11 DIAGNOSIS — F1111 Opioid abuse, in remission: Secondary | ICD-10-CM | POA: Insufficient documentation

## 2023-01-11 DIAGNOSIS — F332 Major depressive disorder, recurrent severe without psychotic features: Secondary | ICD-10-CM | POA: Insufficient documentation

## 2023-01-11 DIAGNOSIS — F1910 Other psychoactive substance abuse, uncomplicated: Secondary | ICD-10-CM

## 2023-01-11 DIAGNOSIS — E876 Hypokalemia: Secondary | ICD-10-CM

## 2023-01-11 DIAGNOSIS — F5105 Insomnia due to other mental disorder: Secondary | ICD-10-CM | POA: Insufficient documentation

## 2023-01-11 DIAGNOSIS — F99 Mental disorder, not otherwise specified: Secondary | ICD-10-CM

## 2023-01-11 DIAGNOSIS — F419 Anxiety disorder, unspecified: Secondary | ICD-10-CM | POA: Diagnosis not present

## 2023-01-11 DIAGNOSIS — R739 Hyperglycemia, unspecified: Secondary | ICD-10-CM

## 2023-01-11 DIAGNOSIS — Z13220 Encounter for screening for lipoid disorders: Secondary | ICD-10-CM

## 2023-01-11 HISTORY — DX: Other psychoactive substance abuse, uncomplicated: F19.10

## 2023-01-11 MED ORDER — MIRTAZAPINE 7.5 MG PO TABS: 7.5000 mg | ORAL_TABLET | Freq: Every day | ORAL | 0 refills | Status: AC

## 2023-01-11 NOTE — Assessment & Plan Note (Signed)
Norton reports significant stress and anxiety related to family and financial issues, with a high PHQ-9 and GAD-7 score.  Plan:  Start mirtazapine 7.5 mg nightly tapering up as needed Consider referral to counseling or mental health specialist for ongoing support Suggest lifestyle modifications, including reducing caffeine intake, and implementing exercise and relaxation techniques

## 2023-01-11 NOTE — Progress Notes (Signed)
Assessment/Plan:   Problem List Items Addressed This Visit       Other   Anxiety   Relevant Medications   mirtazapine (REMERON) 7.5 MG tablet   Other Relevant Orders   TSH   Vitamin D 1,25 dihydroxy   Opioid abuse, in remission (HCC) - Primary    Michael Hurley is a former IV drug user requesting testing for hepatitis  Plan: Screening for blood-borne pathogens including HIV, hepatitis B, and hepatitis C Order hepatitis B and C panel Consider hepatitis B vaccination if not previously done Schedule follow-up for physical examination and further evaluation       Severe episode of recurrent major depressive disorder, without psychotic features (HCC)    Michael Hurley reports significant stress and anxiety related to family and financial issues, with a high PHQ-9 and GAD-7 score.  Plan:  Start mirtazapine 7.5 mg nightly tapering up as needed Consider referral to counseling or mental health specialist for ongoing support Suggest lifestyle modifications, including reducing caffeine intake, and implementing exercise and relaxation techniques      Relevant Medications   mirtazapine (REMERON) 7.5 MG tablet   Other Relevant Orders   Vitamin D 1,25 dihydroxy   Insomnia due to other mental disorder   RESOLVED: IV drug abuse (HCC)   Relevant Orders   CBC with Differential/Platelet   Hepatitis B core antibody, total   Hepatitis B surface antibody,qualitative   Hepatitis B surface antigen   Hepatitis C Antibody   HIV antibody (with reflex)   Other Visit Diagnoses     Hyperglycemia       Relevant Orders   Hemoglobin A1c   Microalbumin / creatinine urine ratio   Urinalysis, Routine w reflex microscopic   Comprehensive metabolic panel   Hypokalemia       Encounter for lipid screening for cardiovascular disease       Relevant Orders   Lipid panel       Medications Discontinued During This Encounter  Medication Reason   methadone (DOLOPHINE) 10 MG/ML solution    meloxicam (MOBIC) 15  MG tablet    ibuprofen (ADVIL) 800 MG tablet    doxycycline (VIBRAMYCIN) 100 MG capsule    cephALEXin (KEFLEX) 500 MG capsule    ALPRAZolam (XANAX) 0.5 MG tablet    naproxen sodium (ANAPROX) 220 MG tablet     Return in about 4 weeks (around 02/08/2023) for mood, physical (fasting labs 1 week before).    Subjective:   Encounter date: 01/11/2023  Michael Hurley is a 47 y.o. male who has Anxiety; Generalized abdominal pain; Irritable bowel syndrome (IBS); Opioid abuse, in remission (HCC); Severe episode of recurrent major depressive disorder, without psychotic features (HCC); and Insomnia due to other mental disorder on their problem list..   He  has a past medical history of IV drug abuse (HCC) (01/11/2023).Marland Kitchen   History of Present Illness:  Hepatitis Concern: Michael Hurley is a former intravenous drug user expressing concerns about potential hepatitis infection. He specifically mentioned wanting to get tested for hepatitis. He is currently asymptomatic, with no reported abdominal pain, nausea, or vomiting.  Stress and Anxiety: Michael Hurley reports significant ongoing stress and anxiety related to his wife's prolonged hospitalization for bacteremia, which required open-heart surgery. He has difficulty sleeping and experiences chronic worry. His 13 year old daughter is struggling to comprehend the situation, and his 11 year old daughter is taking on more responsibilities. Additionally, Michael Hurley mentions he lost his job, further contributing to his stress.He has a history of treatment with Adderall for attention issues and  was prescribed citalopram and Xanax in the past for anxiety and depression, which he discontinued due to side effects.  Opioid Use Disorder: Patient has a history of IV heroin use.  He previously used methadone but is no longer attending the clinic.  He is currently not using any alcohol, drugs, or tobacco.      01/11/2023    2:02 PM  Depression screen PHQ 2/9  Decreased Interest 3   Down, Depressed, Hopeless 0  PHQ - 2 Score 3  Altered sleeping 3  Tired, decreased energy 3  Change in appetite 0  Feeling bad or failure about yourself  3  Trouble concentrating 3  Moving slowly or fidgety/restless 3  Suicidal thoughts 0  PHQ-9 Score 18  Difficult doing work/chores Somewhat difficult      01/11/2023    2:03 PM  GAD 7 : Generalized Anxiety Score  Nervous, Anxious, on Edge 2  Control/stop worrying 3  Worry too much - different things 3  Trouble relaxing 3  Restless 3  Easily annoyed or irritable 3  Afraid - awful might happen 3  Total GAD 7 Score 20  Anxiety Difficulty Very difficult      Review of Systems  Constitutional:  Negative for chills, diaphoresis, fever, malaise/fatigue and weight loss.  HENT:  Negative for congestion, ear discharge, ear pain and hearing loss.   Eyes:  Negative for blurred vision, double vision, photophobia, pain, discharge and redness.  Respiratory:  Negative for cough, sputum production, shortness of breath and wheezing.   Cardiovascular:  Negative for chest pain and palpitations.  Gastrointestinal:  Negative for abdominal pain, blood in stool, constipation, diarrhea, heartburn, melena, nausea and vomiting.  Genitourinary:  Negative for dysuria, flank pain, frequency, hematuria and urgency.  Musculoskeletal:  Negative for myalgias.  Skin:  Negative for itching and rash.  Neurological:  Negative for dizziness, tingling, tremors, speech change, seizures, loss of consciousness, weakness and headaches.  Psychiatric/Behavioral:  Positive for depression. Negative for hallucinations, memory loss, substance abuse and suicidal ideas. The patient is nervous/anxious and has insomnia.   All other systems reviewed and are negative.   History reviewed. No pertinent surgical history.  Outpatient Medications Prior to Visit  Medication Sig Dispense Refill   ALPRAZolam (XANAX) 0.5 MG tablet Take by mouth. (Patient not taking: Reported on  01/11/2023)     cephALEXin (KEFLEX) 500 MG capsule Take 1 capsule (500 mg total) by mouth 4 (four) times daily. (Patient not taking: Reported on 01/11/2023) 20 capsule 0   doxycycline (VIBRAMYCIN) 100 MG capsule Take 1 capsule (100 mg total) by mouth 2 (two) times daily. (Patient not taking: Reported on 01/11/2023) 20 capsule 0   ibuprofen (ADVIL) 800 MG tablet Take 1 tablet (800 mg total) by mouth 3 (three) times daily. (Patient not taking: Reported on 01/11/2023) 21 tablet 0   meloxicam (MOBIC) 15 MG tablet Take 1 tablet (15 mg total) by mouth daily. (Patient not taking: Reported on 01/11/2023) 30 tablet 1   methadone (DOLOPHINE) 10 MG/ML solution Take 110 mg by mouth daily. (Patient not taking: Reported on 01/11/2023)     naproxen sodium (ANAPROX) 220 MG tablet Take 660 mg by mouth 2 (two) times daily as needed. For pain (Patient not taking: Reported on 01/11/2023)     No facility-administered medications prior to visit.    Family History  Problem Relation Age of Onset   Hypertension Father    Hypertension Paternal Grandfather     Social History   Socioeconomic History  Marital status: Married    Spouse name: Not on file   Number of children: Not on file   Years of education: Not on file   Highest education level: Not on file  Occupational History   Not on file  Tobacco Use   Smoking status: Never    Passive exposure: Never   Smokeless tobacco: Not on file  Vaping Use   Vaping Use: Never used  Substance and Sexual Activity   Alcohol use: No   Drug use: Not Currently    Types: Heroin   Sexual activity: Not Currently  Other Topics Concern   Not on file  Social History Narrative   Not on file   Social Determinants of Health   Financial Resource Strain: Not on file  Food Insecurity: Not on file  Transportation Needs: Not on file  Physical Activity: Not on file  Stress: Not on file  Social Connections: Not on file  Intimate Partner Violence: Not on file                                                                                                   Objective:  Physical Exam: BP 136/82 (BP Location: Left Arm, Patient Position: Sitting, Cuff Size: Large)   Pulse 76   Temp 97.6 F (36.4 C) (Temporal)   Ht 6' 1.5" (1.867 m)   Wt 210 lb (95.3 kg)   SpO2 99%   BMI 27.33 kg/m     Physical Exam Constitutional:      Appearance: Normal appearance.  HENT:     Head: Normocephalic and atraumatic.     Right Ear: Hearing normal.     Left Ear: Hearing normal.     Nose: Nose normal.  Eyes:     General: No scleral icterus.       Right eye: No discharge.        Left eye: No discharge.     Extraocular Movements: Extraocular movements intact.  Cardiovascular:     Rate and Rhythm: Normal rate and regular rhythm.     Heart sounds: Normal heart sounds.  Pulmonary:     Effort: Pulmonary effort is normal.     Breath sounds: Normal breath sounds.  Abdominal:     Palpations: Abdomen is soft.     Tenderness: There is no abdominal tenderness.  Skin:    General: Skin is warm.     Findings: No rash.  Neurological:     General: No focal deficit present.     Mental Status: He is alert.     Cranial Nerves: No cranial nerve deficit.  Psychiatric:        Mood and Affect: Mood is anxious and depressed.        Behavior: Behavior is cooperative.        Thought Content: Thought content does not include suicidal ideation.        Judgment: Judgment is not inappropriate.    Patient with mild hypokalemia and hyperglycemia and recent ED visit in January.  Warrants rechecking. Last metabolic panel Lab Results  Component Value Date   GLUCOSE  107 (H) 08/09/2022   NA 137 08/09/2022   K 3.3 (L) 08/09/2022   CL 100 08/09/2022   CO2 27 08/09/2022   BUN 16 08/09/2022   CREATININE 0.96 08/09/2022   GFRNONAA >60 08/09/2022   CALCIUM 10.0 08/09/2022   ANIONGAP 10 08/09/2022   Last CBC Lab Results  Component Value Date   WBC 10.9 (H) 08/09/2022   HGB 14.6 08/09/2022    HCT 44.1 08/09/2022   MCV 87.5 08/09/2022   MCH 29.0 08/09/2022   RDW 13.5 08/09/2022   PLT 298 08/09/2022    No results found.  No results found for this or any previous visit (from the past 2160 hour(s)).      Garner Nash, MD, MS

## 2023-01-11 NOTE — Patient Instructions (Addendum)
Medication: Start on mirtazapine as prescribed to help with anxiety and sleep. Begin with the lowest dose and note any side effects. Lifestyle Recommendations: Try to maintain a healthy routine; incorporate regular exercise, a balanced diet, and limit caffeine intake, especially later in the day.

## 2023-01-11 NOTE — Assessment & Plan Note (Signed)
Florian is a former IV drug user requesting testing for hepatitis  Plan: Screening for blood-borne pathogens including HIV, hepatitis B, and hepatitis C Order hepatitis B and C panel Consider hepatitis B vaccination if not previously done Schedule follow-up for physical examination and further evaluation

## 2023-02-01 ENCOUNTER — Other Ambulatory Visit (INDEPENDENT_AMBULATORY_CARE_PROVIDER_SITE_OTHER): Payer: 59

## 2023-02-01 DIAGNOSIS — F419 Anxiety disorder, unspecified: Secondary | ICD-10-CM | POA: Diagnosis not present

## 2023-02-01 DIAGNOSIS — Z136 Encounter for screening for cardiovascular disorders: Secondary | ICD-10-CM

## 2023-02-01 DIAGNOSIS — R739 Hyperglycemia, unspecified: Secondary | ICD-10-CM

## 2023-02-01 DIAGNOSIS — F1910 Other psychoactive substance abuse, uncomplicated: Secondary | ICD-10-CM | POA: Diagnosis not present

## 2023-02-01 DIAGNOSIS — Z13220 Encounter for screening for lipoid disorders: Secondary | ICD-10-CM | POA: Diagnosis not present

## 2023-02-01 DIAGNOSIS — K732 Chronic active hepatitis, not elsewhere classified: Secondary | ICD-10-CM

## 2023-02-01 DIAGNOSIS — F332 Major depressive disorder, recurrent severe without psychotic features: Secondary | ICD-10-CM

## 2023-02-01 DIAGNOSIS — R7989 Other specified abnormal findings of blood chemistry: Secondary | ICD-10-CM

## 2023-02-01 LAB — LIPID PANEL
Cholesterol: 111 mg/dL (ref 0–200)
HDL: 35.2 mg/dL — ABNORMAL LOW (ref >39.00)
LDL Cholesterol: 45 mg/dL (ref 0–99)
NonHDL: 75.69
Total CHOL/HDL Ratio: 3
Triglycerides: 152 mg/dL — ABNORMAL HIGH (ref 0.0–149.0)
VLDL: 30.4 mg/dL (ref 0.0–40.0)

## 2023-02-01 LAB — COMPREHENSIVE METABOLIC PANEL
ALT: 193 U/L — ABNORMAL HIGH (ref 0–53)
AST: 103 U/L — ABNORMAL HIGH (ref 0–37)
Albumin: 4 g/dL (ref 3.5–5.2)
Alkaline Phosphatase: 95 U/L (ref 39–117)
BUN: 20 mg/dL (ref 6–23)
CO2: 29 mEq/L (ref 19–32)
Calcium: 9.5 mg/dL (ref 8.4–10.5)
Chloride: 102 mEq/L (ref 96–112)
Creatinine, Ser: 1.08 mg/dL (ref 0.40–1.50)
GFR: 81.17 mL/min (ref >60.00)
Glucose, Bld: 127 mg/dL — ABNORMAL HIGH (ref 70–99)
Potassium: 4.1 mEq/L (ref 3.5–5.1)
Sodium: 137 mEq/L (ref 135–145)
Total Bilirubin: 0.5 mg/dL (ref 0.2–1.2)
Total Protein: 7.1 g/dL (ref 6.0–8.3)

## 2023-02-01 LAB — URINALYSIS, ROUTINE W REFLEX MICROSCOPIC
Bilirubin Urine: NEGATIVE
Hgb urine dipstick: NEGATIVE
Ketones, ur: NEGATIVE
Leukocytes,Ua: NEGATIVE
Nitrite: NEGATIVE
RBC / HPF: NONE SEEN (ref 0–?)
Specific Gravity, Urine: >=1.03 — AB (ref 1.000–1.030)
Total Protein, Urine: NEGATIVE
Urine Glucose: NEGATIVE
Urobilinogen, UA: 1 (ref 0.0–1.0)
WBC, UA: NONE SEEN (ref 0–?)
pH: 6 (ref 5.0–8.0)

## 2023-02-01 LAB — CBC WITH DIFFERENTIAL/PLATELET
Basophils Absolute: 0.1 10*3/uL (ref 0.0–0.1)
Basophils Relative: 0.6 % (ref 0.0–3.0)
Eosinophils Absolute: 0.3 10*3/uL (ref 0.0–0.7)
Eosinophils Relative: 3.2 % (ref 0.0–5.0)
HCT: 45.1 % (ref 39.0–52.0)
Hemoglobin: 14.4 g/dL (ref 13.0–17.0)
Lymphocytes Relative: 30.1 % (ref 12.0–46.0)
Lymphs Abs: 2.8 10*3/uL (ref 0.7–4.0)
MCHC: 32 g/dL (ref 30.0–36.0)
MCV: 90 fl (ref 78.0–100.0)
Monocytes Absolute: 1 10*3/uL (ref 0.1–1.0)
Monocytes Relative: 10.6 % (ref 3.0–12.0)
Neutro Abs: 5.1 10*3/uL (ref 1.4–7.7)
Neutrophils Relative %: 55.5 % (ref 43.0–77.0)
Platelets: 226 10*3/uL (ref 150.0–400.0)
RBC: 5.01 Mil/uL (ref 4.22–5.81)
RDW: 15.6 % — ABNORMAL HIGH (ref 11.5–15.5)
WBC: 9.3 10*3/uL (ref 4.0–10.5)

## 2023-02-01 LAB — MICROALBUMIN / CREATININE URINE RATIO
Creatinine,U: 154.9 mg/dL
Microalb Creat Ratio: 0.5 mg/g (ref 0.0–30.0)
Microalb, Ur: <0.7 mg/dL (ref 0.0–1.9)

## 2023-02-01 LAB — TSH: TSH: 2.94 u[IU]/mL (ref 0.35–5.50)

## 2023-02-01 LAB — HEMOGLOBIN A1C: Hgb A1c MFr Bld: 5.7 % (ref 4.6–6.5)

## 2023-02-03 NOTE — Addendum Note (Signed)
Addended by: Garnette Gunner on: 02/03/2023 09:56 AM   Modules accepted: Orders

## 2023-02-04 NOTE — Addendum Note (Signed)
Addended by: Fanny Bien B on: 02/04/2023 01:23 PM   Modules accepted: Orders

## 2023-02-05 LAB — HCV RNA,QUANTITATIVE REAL TIME PCR
HCV Quantitative Log: 5.96 Log IU/mL — ABNORMAL HIGH
HCV RNA, PCR, QN: 909000 IU/mL — ABNORMAL HIGH

## 2023-02-05 LAB — VITAMIN D 1,25 DIHYDROXY
Vitamin D 1, 25 (OH)2 Total: 27 pg/mL (ref 18–72)
Vitamin D2 1, 25 (OH)2: <8 pg/mL
Vitamin D3 1, 25 (OH)2: 27 pg/mL

## 2023-02-05 LAB — HIV ANTIBODY (ROUTINE TESTING W REFLEX): HIV 1&2 Ab, 4th Generation: NONREACTIVE

## 2023-02-05 LAB — HEPATITIS B SURFACE ANTIGEN: Hepatitis B Surface Ag: NONREACTIVE

## 2023-02-05 LAB — HEPATITIS C ANTIBODY: Hepatitis C Ab: REACTIVE — AB

## 2023-02-05 LAB — HEPATITIS B CORE ANTIBODY, TOTAL: Hep B Core Total Ab: NONREACTIVE

## 2023-02-05 LAB — HEPATITIS B SURFACE ANTIBODY,QUALITATIVE: Hep B S Ab: NONREACTIVE

## 2023-02-08 ENCOUNTER — Encounter: Payer: Medicaid Other | Admitting: Family Medicine

## 2023-02-21 ENCOUNTER — Telehealth: Payer: Self-pay | Admitting: Family Medicine

## 2023-02-21 ENCOUNTER — Encounter: Payer: MEDICAID | Admitting: Family Medicine

## 2023-02-21 NOTE — Telephone Encounter (Signed)
8.5.24 no show letter sent

## 2023-02-22 ENCOUNTER — Other Ambulatory Visit (HOSPITAL_COMMUNITY): Payer: Self-pay

## 2023-02-22 ENCOUNTER — Telehealth: Payer: Self-pay

## 2023-02-22 NOTE — Telephone Encounter (Signed)
[facility] Patient Product/process development scientist completed.    The patient is insured through [employer].  Medication will need a PA .  Insurance prefers [place] or [place].  We will continue to follow to see if copay assistance is needed.  Clearance Coots, CPhT Specialty Pharmacy Patient [facility] Phone: [phone] Fax:  [phone]

## 2023-02-23 ENCOUNTER — Encounter: Payer: MEDICAID | Admitting: Family

## 2023-03-04 NOTE — Telephone Encounter (Signed)
1st no show, letter sent via mail

## 2023-03-24 ENCOUNTER — Other Ambulatory Visit (HOSPITAL_COMMUNITY): Payer: Self-pay

## 2023-03-29 ENCOUNTER — Encounter: Payer: MEDICAID | Admitting: Family

## 2023-04-05 ENCOUNTER — Encounter: Payer: MEDICAID | Admitting: Family Medicine

## 2024-02-26 ENCOUNTER — Ambulatory Visit
Admission: EM | Admit: 2024-02-26 | Discharge: 2024-02-26 | Disposition: A | Discharge status: Home / Self Care | Payer: MEDICAID | Attending: Internal Medicine | Admitting: Internal Medicine

## 2024-02-26 ENCOUNTER — Encounter: Payer: Self-pay | Admitting: *Deleted

## 2024-02-26 ENCOUNTER — Other Ambulatory Visit: Payer: Self-pay

## 2024-02-26 DIAGNOSIS — L03213 Periorbital cellulitis: Secondary | ICD-10-CM

## 2024-02-26 DIAGNOSIS — H109 Unspecified conjunctivitis: Secondary | ICD-10-CM

## 2024-02-26 MED ORDER — ERYTHROMYCIN 5 MG/GM OP OINT: TOPICAL_OINTMENT | OPHTHALMIC | 0 refills | Status: AC

## 2024-02-26 MED ORDER — AMOXICILLIN-POT CLAVULANATE 875-125 MG PO TABS: 1.0000 | ORAL_TABLET | Freq: Two times a day (BID) | ORAL | 0 refills | Status: AC

## 2024-02-26 NOTE — ED Triage Notes (Signed)
 Pt reports left eye red and draining since Friday. States his wife has the same symptoms after being in the hospital. His symptoms started after he kissed her. Denies pain, states feels scratchy

## 2024-02-26 NOTE — Discharge Instructions (Addendum)
 Symptoms and physical exam findings are most consistent with bacterial conjunctivitis (pinkeye) as well as preseptal cellulitis which is an infection in the soft tissue around the eye.  This can be treated with both topical antibiotics as well as antibiotics by mouth.  We will treat with the following: Augmentin  875/125 mg 1 tablet twice daily for 7 days.  This is an antibiotic.  Take this with food. Erythromycin  ointment apply a small amount into the eye directly by pulling the left lower eyelid down and applying it into the eye.  Do this nightly for 7 days. May want to consider using sunglasses to help with light sensitivity Return to urgent care or PCP if symptoms worsen or fail to resolve.

## 2024-02-26 NOTE — ED Provider Notes (Signed)
 [facility]    CSN: [id number] Arrival date & time: 02/26/24  1421      History   Chief Complaint Chief Complaint  Patient presents with   Eye Drainage    HPI Michael Hurley is a 48 y.o. male.   48 y.o. male who presents to urgent care with complaints of left eye pain, swelling, redness and drainage.  This started on Friday.  He reports it his wife initially had symptoms and she had been having problems for about a week.  He then developed symptoms on Friday.  He reports that that has worsened.  He is having some visual light sensitivity.  He is able to see out of the eye.  He denies any fevers or chills.     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 No pertinent surgical history.     Home Medications    Prior to Admission medications   Medication Sig Start Date End Date Taking? Authorizing Provider  amoxicillin -clavulanate (AUGMENTIN ) 875-125 MG tablet Take 1 tablet by mouth every 12 (twelve) hours. 02/26/24  Yes Margret Moat A, PA-C  erythromycin  ophthalmic ointment Place a 1/2 inch ribbon of ointment into the left lower eyelid nightly for 7 days. 02/26/24  Yes Lakrista Scaduto A, PA-C  mirtazapine  (REMERON ) 7.5 MG tablet Take 1 tablet (7.5 mg total) by mouth at bedtime. Patient not taking: Reported on 02/26/2024 01/11/23 02/10/23  Sebastian Beverley NOVAK, MD    Family History Family History  Problem Relation Age of Onset   Hypertension Father    Hypertension Paternal Grandfather     Social History Social History   Tobacco Use   Smoking status:  Never    Passive exposure: Never  Vaping Use   Vaping status: Never Used  Substance Use Topics   Alcohol use: No   Drug use: Not Currently    Types: Heroin     Allergies   Patient has no known allergies.   Review of Systems Review of Systems  Constitutional:  Negative for chills and fever.  HENT:  Negative for ear pain and sore throat.   Eyes:  Positive for pain, discharge and redness. Negative for visual disturbance.  Respiratory:  Negative for cough and shortness of breath.   Cardiovascular:  Negative for chest pain and palpitations.  Gastrointestinal:  Negative for abdominal pain and vomiting.  Genitourinary:  Negative for dysuria and hematuria.  Musculoskeletal:  Negative for arthralgias and back pain.  Skin:  Negative for color change and rash.  Neurological:  Negative for seizures and syncope.  All other systems reviewed and are negative.    Physical Exam Triage Vital Signs ED Triage Vitals  Encounter Vitals Group     BP 02/26/24 1443 130/84     Girls Systolic BP Percentile --      Girls Diastolic BP Percentile --      Boys Systolic BP Percentile --      Boys Diastolic BP Percentile --      Pulse Rate 02/26/24 1443 73     Resp 02/26/24 1443 16  Temp 02/26/24 1443 98.3 F (36.8 C)     Temp Source 02/26/24 1443 Oral     SpO2 02/26/24 1443 97 %     Weight --      Height --      Head Circumference --      Peak Flow --      Pain Score 02/26/24 1442 0     Pain Loc --      Pain Education --      Exclude from Growth Chart --    No data found.  Updated Vital Signs BP 130/84 (BP Location: Left Arm)   Pulse 73   Temp 98.3 F (36.8 C) (Oral)   Resp 16   SpO2 97%   Visual Acuity Right Eye Distance: 20/30 Left Eye Distance: 20/40 Bilateral Distance: 20/25 (Uncorrected)  Right Eye Near:   Left Eye Near:    Bilateral Near:     Physical Exam Vitals and nursing note reviewed.  Constitutional:      General: He is not in acute distress.     Appearance: He is well-developed.  HENT:     Head: Normocephalic and atraumatic.  Eyes:     General:        Right eye: Discharge present.        Left eye: Discharge present.    Extraocular Movements: Extraocular movements intact.     Right eye: Normal extraocular motion.     Left eye: Normal extraocular motion.     Conjunctiva/sclera:     Right eye: Right conjunctiva is injected. Exudate present.     Left eye: Left conjunctiva is injected. Exudate present.     Pupils: Pupils are equal, round, and reactive to light.     Comments: Tissue swelling around the left eye with erythema.   Cardiovascular:     Rate and Rhythm: Normal rate and regular rhythm.     Heart sounds: No murmur heard. Pulmonary:     Effort: Pulmonary effort is normal. No respiratory distress.     Breath sounds: Normal breath sounds.  Abdominal:     Palpations: Abdomen is soft.     Tenderness: There is no abdominal tenderness.  Musculoskeletal:        General: No swelling.     Cervical back: Neck supple.  Skin:    General: Skin is warm and dry.     Capillary Refill: Capillary refill takes less than 2 seconds.  Neurological:     Mental Status: He is alert.  Psychiatric:        Mood and Affect: Mood normal.      UC Treatments / Results  Labs (all labs ordered are listed, but only abnormal results are displayed) Labs Reviewed - No data to display  EKG   Radiology No results found.  Procedures Procedures (including critical care time)  Medications Ordered in UC Medications - No data to display  Initial Impression / Assessment and Plan / UC Course  I have reviewed the triage vital signs and the nursing notes.  Pertinent labs & imaging results that were available during my care of the patient were reviewed by me and considered in my medical decision making (see chart for details).     Bacterial conjunctivitis of left eye  Preseptal cellulitis of left eye   Symptoms and physical exam findings  are most consistent with bacterial conjunctivitis (pinkeye) as well as preseptal cellulitis which is an infection in the soft tissue around the eye.  This can be  treated with both topical antibiotics as well as antibiotics by mouth.  We will treat with the following: Augmentin  875/125 mg 1 tablet twice daily for 7 days.  This is an antibiotic.  Take this with food. Erythromycin  ointment apply a small amount into the eye directly by pulling the left lower eyelid down and applying it into the eye.  Do this nightly for 7 days. May want to consider using sunglasses to help with light sensitivity Return to urgent care or PCP if symptoms worsen or fail to resolve.    Final Clinical Impressions(s) / UC Diagnoses   Final diagnoses:  Bacterial conjunctivitis of left eye  Preseptal cellulitis of left eye     Discharge Instructions      Symptoms and physical exam findings are most consistent with bacterial conjunctivitis (pinkeye) as well as preseptal cellulitis which is an infection in the soft tissue around the eye.  This can be treated with both topical antibiotics as well as antibiotics by mouth.  We will treat with the following: Augmentin  875/125 mg 1 tablet twice daily for 7 days.  This is an antibiotic.  Take this with food. Erythromycin  ointment apply a small amount into the eye directly by pulling the left lower eyelid down and applying it into the eye.  Do this nightly for 7 days. May want to consider using sunglasses to help with light sensitivity Return to urgent care or PCP if symptoms worsen or fail to resolve.      ED Prescriptions     Medication Sig Dispense Auth. Provider   erythromycin  ophthalmic ointment Place a 1/2 inch ribbon of ointment into the left lower eyelid nightly for 7 days. 3.5 g Teresa Norris A, PA-C   amoxicillin -clavulanate (AUGMENTIN ) 875-125 MG tablet Take 1 tablet by mouth every 12 (twelve) hours. 14 tablet Teresa Norris LABOR, PA-C      PDMP not reviewed  this encounter.   Teresa Norris LABOR, [place] 02/26/24 1529
